# Patient Record
Sex: Female | Born: 1980 | Race: Black or African American | Hispanic: No | Marital: Married | Smoking: Current every day smoker
Health system: Southern US, Community
[De-identification: ages and names within clinical notes are randomized; demographics above are authoritative.]

## PROBLEM LIST (undated history)

## (undated) DIAGNOSIS — R519 Headache, unspecified: Secondary | ICD-10-CM

## (undated) DIAGNOSIS — N809 Endometriosis, unspecified: Secondary | ICD-10-CM

## (undated) DIAGNOSIS — R42 Dizziness and giddiness: Secondary | ICD-10-CM

## (undated) HISTORY — DX: Headache, unspecified: R51.9

## (undated) HISTORY — PX: KNEE ARTHROSCOPY: SUR90

## (undated) HISTORY — PX: TUBAL LIGATION: SHX77

## (undated) HISTORY — DX: Dizziness and giddiness: R42

## (undated) HISTORY — DX: Endometriosis, unspecified: N80.9

---

## 2020-04-04 DIAGNOSIS — N939 Abnormal uterine and vaginal bleeding, unspecified: Secondary | ICD-10-CM | POA: Diagnosis not present

## 2020-04-04 DIAGNOSIS — N920 Excessive and frequent menstruation with regular cycle: Secondary | ICD-10-CM | POA: Diagnosis not present

## 2020-12-18 DIAGNOSIS — Z419 Encounter for procedure for purposes other than remedying health state, unspecified: Secondary | ICD-10-CM | POA: Diagnosis not present

## 2021-01-18 DIAGNOSIS — Z419 Encounter for procedure for purposes other than remedying health state, unspecified: Secondary | ICD-10-CM | POA: Diagnosis not present

## 2021-02-17 DIAGNOSIS — Z419 Encounter for procedure for purposes other than remedying health state, unspecified: Secondary | ICD-10-CM | POA: Diagnosis not present

## 2021-03-20 DIAGNOSIS — Z419 Encounter for procedure for purposes other than remedying health state, unspecified: Secondary | ICD-10-CM | POA: Diagnosis not present

## 2021-04-19 DIAGNOSIS — Z419 Encounter for procedure for purposes other than remedying health state, unspecified: Secondary | ICD-10-CM | POA: Diagnosis not present

## 2021-05-20 DIAGNOSIS — Z419 Encounter for procedure for purposes other than remedying health state, unspecified: Secondary | ICD-10-CM | POA: Diagnosis not present

## 2021-06-20 DIAGNOSIS — Z419 Encounter for procedure for purposes other than remedying health state, unspecified: Secondary | ICD-10-CM | POA: Diagnosis not present

## 2021-07-18 DIAGNOSIS — Z419 Encounter for procedure for purposes other than remedying health state, unspecified: Secondary | ICD-10-CM | POA: Diagnosis not present

## 2021-08-18 DIAGNOSIS — Z419 Encounter for procedure for purposes other than remedying health state, unspecified: Secondary | ICD-10-CM | POA: Diagnosis not present

## 2021-09-17 DIAGNOSIS — Z419 Encounter for procedure for purposes other than remedying health state, unspecified: Secondary | ICD-10-CM | POA: Diagnosis not present

## 2021-09-24 ENCOUNTER — Emergency Department (HOSPITAL_COMMUNITY): Payer: Managed Care, Other (non HMO)

## 2021-09-24 ENCOUNTER — Other Ambulatory Visit: Payer: Self-pay

## 2021-09-24 ENCOUNTER — Emergency Department (HOSPITAL_COMMUNITY)
Admission: EM | Admit: 2021-09-24 | Discharge: 2021-09-24 | Disposition: A | Discharge status: Home / Self Care | Payer: Managed Care, Other (non HMO) | Attending: Emergency Medicine | Admitting: Emergency Medicine

## 2021-09-24 ENCOUNTER — Encounter (HOSPITAL_COMMUNITY): Payer: Self-pay

## 2021-09-24 DIAGNOSIS — Z20822 Contact with and (suspected) exposure to covid-19: Secondary | ICD-10-CM | POA: Insufficient documentation

## 2021-09-24 DIAGNOSIS — R519 Headache, unspecified: Secondary | ICD-10-CM | POA: Insufficient documentation

## 2021-09-24 DIAGNOSIS — R110 Nausea: Secondary | ICD-10-CM | POA: Insufficient documentation

## 2021-09-24 DIAGNOSIS — R42 Dizziness and giddiness: Secondary | ICD-10-CM | POA: Diagnosis present

## 2021-09-24 DIAGNOSIS — M542 Cervicalgia: Secondary | ICD-10-CM | POA: Diagnosis not present

## 2021-09-24 DIAGNOSIS — H532 Diplopia: Secondary | ICD-10-CM | POA: Insufficient documentation

## 2021-09-24 LAB — BASIC METABOLIC PANEL
Anion gap: 7 (ref 5–15)
BUN: 9 mg/dL (ref 6–20)
CO2: 24 mmol/L (ref 22–32)
Calcium: 8.7 mg/dL — ABNORMAL LOW (ref 8.9–10.3)
Chloride: 105 mmol/L (ref 98–111)
Creatinine, Ser: 0.72 mg/dL (ref 0.44–1.00)
GFR, Estimated: >60 mL/min (ref >60)
Glucose, Bld: 94 mg/dL (ref 70–99)
Potassium: 3.6 mmol/L (ref 3.5–5.1)
Sodium: 136 mmol/L (ref 135–145)

## 2021-09-24 LAB — RESP PANEL BY RT-PCR (FLU A&B, COVID) ARPGX2
Influenza A by PCR: NEGATIVE
Influenza B by PCR: NEGATIVE
SARS Coronavirus 2 by RT PCR: NEGATIVE

## 2021-09-24 LAB — CBC WITH DIFFERENTIAL/PLATELET
Abs Immature Granulocytes: 0.02 10*3/uL (ref 0.00–0.07)
Basophils Absolute: 0 10*3/uL (ref 0.0–0.1)
Basophils Relative: 1 %
Eosinophils Absolute: 0.2 10*3/uL (ref 0.0–0.5)
Eosinophils Relative: 3 %
HCT: 38.8 % (ref 36.0–46.0)
Hemoglobin: 12.1 g/dL (ref 12.0–15.0)
Immature Granulocytes: 0 %
Lymphocytes Relative: 35 %
Lymphs Abs: 1.9 10*3/uL (ref 0.7–4.0)
MCH: 25.8 pg — ABNORMAL LOW (ref 26.0–34.0)
MCHC: 31.2 g/dL (ref 30.0–36.0)
MCV: 82.7 fL (ref 80.0–100.0)
Monocytes Absolute: 0.5 10*3/uL (ref 0.1–1.0)
Monocytes Relative: 8 %
Neutro Abs: 3 10*3/uL (ref 1.7–7.7)
Neutrophils Relative %: 53 %
Platelets: 271 10*3/uL (ref 150–400)
RBC: 4.69 MIL/uL (ref 3.87–5.11)
RDW: 18.8 % — ABNORMAL HIGH (ref 11.5–15.5)
WBC: 5.6 10*3/uL (ref 4.0–10.5)
nRBC: 0 % (ref 0.0–0.2)

## 2021-09-24 LAB — I-STAT BETA HCG BLOOD, ED (MC, WL, AP ONLY): I-stat hCG, quantitative: <5 m[IU]/mL (ref <5)

## 2021-09-24 MED ORDER — KETOROLAC TROMETHAMINE 15 MG/ML IJ SOLN
15.0000 mg | Freq: Once | INTRAMUSCULAR | Status: AC
  Administered 2021-09-24: 15 mg via INTRAVENOUS
  Filled 2021-09-24: qty 1

## 2021-09-24 MED ORDER — MECLIZINE HCL 25 MG PO TABS
12.5000 mg | ORAL_TABLET | Freq: Once | ORAL | Status: AC
  Administered 2021-09-24: 12.5 mg via ORAL
  Filled 2021-09-24: qty 1

## 2021-09-24 MED ORDER — MECLIZINE HCL 25 MG PO TABS: 25.0000 mg | ORAL_TABLET | Freq: Three times a day (TID) | ORAL | 0 refills | Status: AC | PRN

## 2021-09-24 MED ORDER — METOCLOPRAMIDE HCL 5 MG/ML IJ SOLN
10.0000 mg | Freq: Once | INTRAMUSCULAR | Status: AC
  Administered 2021-09-24: 10 mg via INTRAVENOUS
  Filled 2021-09-24: qty 2

## 2021-09-24 MED ORDER — DIPHENHYDRAMINE HCL 50 MG/ML IJ SOLN
12.5000 mg | Freq: Once | INTRAMUSCULAR | Status: AC
  Administered 2021-09-24: 12.5 mg via INTRAVENOUS
  Filled 2021-09-24: qty 1

## 2021-09-24 MED ORDER — SODIUM CHLORIDE 0.9 % IV BOLUS
1000.0000 mL | Freq: Once | INTRAVENOUS | Status: AC
  Administered 2021-09-24: 1000 mL via INTRAVENOUS

## 2021-09-24 MED ORDER — GADOBUTROL 1 MMOL/ML IV SOLN
9.0000 mL | Freq: Once | INTRAVENOUS | Status: AC | PRN
  Administered 2021-09-24: 9 mL via INTRAVENOUS

## 2021-09-24 NOTE — ED Provider Triage Note (Signed)
Emergency Medicine Provider Triage Evaluation Note ? ?Montey Hora , a 41 y.o. female  was evaluated in triage.  Pt complains of intermittent neck stiffness and dizziness.  Patient's intermittent neck stiffness started last week.  Patient reports that yesterday she began having dizziness with changes in position.  Patient reports that feels like the room is spinning during these episodes.  Patient states that she has had intermittent headache, bilateral ear fullness over the last week as well. ? ?Review of Systems  ?Positive: Neck stiffness, dizziness, headache, ear fullness ?Negative: Fever, chills, syncope, chest pain, shortness of breath, diplopia, numbness, weakness, facial asymmetry ? ?Physical Exam  ?BP (!) 152/108 (BP Location: Right Arm)   Pulse 96   Temp 99 ?F (37.2 ?C) (Oral)   Resp 18   SpO2 99%  ?Gen:   Awake, no distress   ?Resp:  Normal effort, clear to auscultation bilaterally ?MSK:   Moves extremities without difficulty  ?Other:  +2 radial pulse bilaterally.  Abdomen soft, nondistended, nontender no guarding or rebound tenderness. ? ?No facial asymmetry.  Speaks in full complete sentences without difficulty. ? ?No swelling to submandibular space.  Neck supple.  No swelling, exudate, or erythema to tonsils bilaterally. ? ?Medical Decision Making  ?Medically screening exam initiated at 12:17 PM.  Appropriate orders placed.  Eilleen Flloyd was informed that the remainder of the evaluation will be completed by another provider, this initial triage assessment does not replace that evaluation, and the importance of remaining in the ED until their evaluation is complete. ? ? ?  ?Haskel Schroeder, PA-C ?09/24/21 1224 ? ?

## 2021-09-24 NOTE — ED Notes (Signed)
Patient transported to CT 

## 2021-09-24 NOTE — ED Provider Notes (Addendum)
Patient awaiting MRI to rule out stroke, MS.  Here with headache, dizziness.  Has had migraines in the past.  Headache cocktail has helped her headache.  She is asymptomatic on my evaluation.  Awaiting MRI. ? ?Overall MRI shows no stroke.  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  We will have her follow-up with neurology.  Suspect this could be atypical migraine, vertigo.  Discharged in good condition.  Will prescribe meclizine. ? ?This chart was dictated using voice recognition software.  Despite best efforts to proofread,  errors can occur which can change the documentation meaning.  ?  Lennice Sites, DO ?09/24/21 2042 ? ?  ?Lennice Sites, DO ?09/24/21 2043 ? ?

## 2021-09-24 NOTE — ED Triage Notes (Signed)
Patient complains of intermittent neck stiffness x 1 week and dizziness with any position change since yesterday. Dizziness ongoing today, complains of head and bilateral ear fullness. Alert and oriented ?

## 2021-09-24 NOTE — ED Notes (Signed)
Patient transported to MRI 

## 2021-09-24 NOTE — Discharge Instructions (Addendum)
Follow-up with neurology to further discuss your headaches and dizziness.  Recommend continued use of Tylenol and ibuprofen as needed.  I have also prescribed you meclizine to help with any other dizzy symptoms as well. ?

## 2021-09-24 NOTE — ED Provider Notes (Signed)
?[facility] ?Provider Note ? ? ?CSN: [id number] ?Arrival date & time: 09/24/21  1204 ? ?  ? ?History ?PMH: n/a ?Chief Complaint  ?Patient presents with  ? Dizziness  ? ? ?Wendy Church is a 41 y.o. female. ?Presents the ED with a chief complaint of dizziness and headache.  She states over the past week she has had some vague neck aching.  She says last night around 8 PM she had sudden onset headache as well as dizziness.  She says the headache feels like pressure in her bilateral temporal regions.  She does not have migraines as never had a headache like this before.  She has associated nausea.  She describes dizziness as room spinning sensation.  This is constant and not associated with turning her head or any sudden movements.  She has never had vertigo before.  She says that she does have some diplopia as well.  Denies any numbness or weakness.  Denies chest pain, shortness of breath, abdominal pain.  Denies URI symptoms.  Denies fevers or chills. ? ? ?Dizziness ?Associated symptoms: headaches and nausea   ? ?  ? ?Home Medications ?Prior to Admission medications   ?Not on File  ?   ? ?Allergies    ?Patient has no allergy information on record.   ? ?Review of Systems   ?Review of Systems  ?Gastrointestinal:  Positive for nausea.  ?Musculoskeletal:  Positive for neck stiffness.  ?Neurological:  Positive for dizziness and headaches.  ?All other systems reviewed and are negative. ? ?Physical Exam ?Updated Vital Signs ?BP (!) 129/97   Pulse 85   Temp 99 ?F (37.2 ?C) (Oral)   Resp (!) 28   SpO2 98%  ?Physical Exam ?Vitals and nursing note reviewed.  ?Constitutional:   ?   General: She is not in acute distress. ?   Appearance: Normal appearance. She is not ill-appearing, toxic-appearing or diaphoretic.  ?HENT:  ?   Head: Normocephalic and atraumatic.  ?   Right Ear: Tympanic membrane, ear canal and external ear normal. There is no impacted cerumen.  ?   Left Ear: Tympanic membrane,  ear canal and external ear normal. There is no impacted cerumen.  ?   Nose: Nose normal. No nasal deformity, congestion or rhinorrhea.  ?   Mouth/Throat:  ?   Lips: Pink. No lesions.  ?   Mouth: Mucous membranes are moist. No injury, lacerations, oral lesions or angioedema.  ?   Pharynx: Oropharynx is clear. Uvula midline. No pharyngeal swelling, oropharyngeal exudate, posterior oropharyngeal erythema or uvula swelling.  ?Eyes:  ?   General: Gaze aligned appropriately. No scleral icterus.    ?   Right eye: No discharge.     ?   Left eye: No discharge.  ?   Extraocular Movements: Extraocular movements intact.  ?   Conjunctiva/sclera: Conjunctivae normal.  ?   Right eye: Right conjunctiva is not injected. No exudate or hemorrhage. ?   Left eye: Left conjunctiva is not injected. No exudate or hemorrhage. ?   Pupils: Pupils are equal, round, and reactive to light.  ?   Comments: Difficulty with upward EOM. Possible nystagmus noted.   ?Cardiovascular:  ?   Rate and Rhythm: Normal rate and regular rhythm.  ?   Pulses: Normal pulses.     ?     Radial pulses are 2+ on the right side and 2+ on the left side.  ?     Dorsalis pedis pulses are  2+ on the right side and 2+ on the left side.  ?   Heart sounds: Normal heart sounds, S1 normal and S2 normal. Heart sounds not distant. No murmur heard. ?  No friction rub. No gallop. No S3 or S4 sounds.  ?Pulmonary:  ?   Effort: Pulmonary effort is normal. No accessory muscle usage or respiratory distress.  ?   Breath sounds: Normal breath sounds. No stridor. No wheezing, rhonchi or rales.  ?Chest:  ?   Chest wall: No tenderness.  ?Abdominal:  ?   General: Abdomen is flat. Bowel sounds are normal. There is no distension.  ?   Palpations: Abdomen is soft. There is no mass or pulsatile mass.  ?   Tenderness: There is no abdominal tenderness. There is no right CVA tenderness, left CVA tenderness, guarding or rebound.  ?Musculoskeletal:  ?   Cervical back: Neck supple. No rigidity.  ?    Right lower leg: No edema.  ?   Left lower leg: No edema.  ?Skin: ?   General: Skin is warm and dry.  ?   Coloration: Skin is not jaundiced or pale.  ?   Findings: No bruising, erythema, lesion or rash.  ?Neurological:  ?   General: No focal deficit present.  ?   Mental Status: She is alert and oriented to person, place, and time.  ?   GCS: GCS eye subscore is 4. GCS verbal subscore is 5. GCS motor subscore is 6.  ?   Comments: Alert and Oriented x 3 ?Speech clear with no aphasia ?Cranial Nerve testing ?- PERRLA. Difficulty with vertical upward EOM, possible nystagmus noted. ?- Facial Sensation grossly intact ?- No facial asymmetry ?- Uvula and Tongue Midline ?- Accessory Muscles intact ?Motor: ?- 5/5 motor strength in all four extremities.  ?- No pronator Drift ?Sensation: ?- Grossly intact in all four extremities.  ?Coordination:  ?- Finger to nose with bilateral upper extremity difficulty (L worse than R). Heel to shin intact bilaterally ?  ?Psychiatric:     ?   Mood and Affect: Mood normal.     ?   Behavior: Behavior normal. Behavior is cooperative.  ? ? ?ED Results / Procedures / Treatments   ?Labs ?(all labs ordered are listed, but only abnormal results are displayed) ?Labs Reviewed  ?BASIC METABOLIC PANEL - Abnormal; Notable for the following components:  ?    Result Value  ? Calcium 8.7 (*)   ? All other components within normal limits  ?CBC WITH DIFFERENTIAL/PLATELET - Abnormal; Notable for the following components:  ? MCH 25.8 (*)   ? RDW 18.8 (*)   ? All other components within normal limits  ?RESP PANEL BY RT-PCR (FLU A&B, COVID) ARPGX2  ?I-STAT BETA HCG BLOOD, ED ([facility], [facility], [facility] ONLY)  ? ? ?EKG ?EKG Interpretation ? ?Date/Time:  Monday Sep 24 2021 12:21:58 EDT ?Ventricular Rate:  91 ?PR Interval:  158 ?QRS Duration: 82 ?QT Interval:  342 ?QTC Calculation: 420 ?R Axis:   60 ?Text Interpretation: Normal sinus rhythm Normal ECG No previous ECGs available Confirmed by Lennice Sites [phone]) on 09/24/2021 3:33:59  PM ? ?Radiology ?CT Head Wo Contrast ? ?Result Date: 09/24/2021 ?CLINICAL DATA:  Headache, new or worsening, neuro deficit (Age 13-49y). EXAM: CT HEAD WITHOUT CONTRAST TECHNIQUE: Contiguous axial images were obtained from the base of the skull through the vertex without intravenous contrast. RADIATION DOSE REDUCTION: This exam was performed according to the departmental dose-optimization program which includes automated exposure control, adjustment  of the mA and/or kV according to patient size and/or use of iterative reconstruction technique. COMPARISON:  None Available. FINDINGS: Brain: There is no evidence of an acute infarct, intracranial hemorrhage, mass, midline shift, or extra-axial fluid collection. The ventricles and sulci are normal. Vascular: No hyperdense vessel. Skull: No fracture or suspicious osseous lesion. Sinuses/Orbits: Moderate mucosal thickening in the bilateral ethmoid and left frontal sinuses. Clear mastoid air cells. Unremarkable orbits. Other: None. IMPRESSION: 1. Unremarkable CT appearance of the brain. 2. Moderate paranasal sinus inflammatory mucosal disease. Electronically Signed   By: Logan Bores M.D.   On: 09/24/2021 13:55   ? ?Procedures ?Procedures  ? ?Medications Ordered in ED ?Medications  ?sodium chloride 0.9 % bolus 1,000 mL (1,000 mLs Intravenous New Bag/Given 09/24/21 1355)  ?meclizine (ANTIVERT) tablet 12.5 mg (12.5 mg Oral Given 09/24/21 1350)  ?ketorolac (TORADOL) 15 MG/ML injection 15 mg (15 mg Intravenous Given 09/24/21 1410)  ?metoCLOPramide (REGLAN) injection 10 mg (10 mg Intravenous Given 09/24/21 1410)  ?diphenhydrAMINE (BENADRYL) injection 12.5 mg (12.5 mg Intravenous Given 09/24/21 1409)  ? ? ?ED Course/ Medical Decision Making/ A&P ?  ?                        ?Medical Decision Making ?Amount and/or Complexity of Data Reviewed ?Radiology: ordered. ? ?Risk ?Prescription drug management. ? ? ? ?MDM  ?This is a 41 y.o. female who presents to the ED with headache and persistent  dizziness since 8 pm last night ?The differential of this patient includes but is not limited to cervical artery dissection, Encephalitis, Meningitis, Mass Occupying lesion, Pseudotumor (IIH), SAH, Cluster Headach

## 2021-10-01 NOTE — Progress Notes (Signed)
? ?Referring:  ?Curatolo, Adam, DO ?[address] ?[place],  [place] [address] ? ?PCP: ?Pcp, No ? ?Neurology was asked to evaluate Wendy Church, a 41 year old female for a chief complaint of headaches.  Our recommendations of care will be communicated by shared medical record.   ? ?CC:  headaches ? ?History provided from self, husband ? ?HPI:  ?Medical co-morbidities: migraine ? ?The patient presents for evaluation of vertigo and headaches which began on week ago. She was outside when she developed a sensation of room spinning and imbalance. She also felt flushed. She then developed a headache with associated blurred vision, photophobia, phonophobia, and nausea. Symptoms initially improved and she went to sleep that night. Felt okay the next morning at first, but then the symptoms returned. She presented to the ED 09/24/21 where MRI brain showed a partially empty sella, narrowing of distal transverse sinuses, and optic nerve sheath tortuosity. She was given meclizine for vertigo which was not helpful. She was also given a migraine cocktail which helped reduce her headache severity.  ? ?Since discharge from the ED she has continued to have a headache but it is less severe. Has had a couple of episodes of mild vertigo, but none as severe as the initial event. She does endorse pulsatile tinnitus, but does not believe this is a new symptom. ? ?She reports a history of migraines when she was younger. They improved over time and she only had them intermittently until this episode. ? ?Headache History: ?Onset: 1 week ago ?Triggers: loud noises ?Aura: blurry vision ?Location: frontal, temples ?Quality/Description: pressure, throbbing ?Associated Symptoms: ? Photophobia: yes ? Phonophobia: yes ? Nausea: yes ? +pulsatile tinnitus ?Worse with activity?: yes ?Duration of headaches: 1 week ? ?Headache days per month: 7 ?Headache free days per month: 23 ? ?Current Treatment: ?Abortive ?ibuprofen ? ?Preventative ?none ? ?Prior Therapies                                  ?Ibuprofen ?Meclizine - lack of efficacy ? ? ? ?LABS: ?CBC ?   ?Component Value Date/Time  ? WBC 5.6 09/24/2021 1225  ? RBC 4.69 09/24/2021 1225  ? HGB 12.1 09/24/2021 1225  ? HCT 38.8 09/24/2021 1225  ? PLT 271 09/24/2021 1225  ? MCV 82.7 09/24/2021 1225  ? MCH 25.8 (L) 09/24/2021 1225  ? MCHC 31.2 09/24/2021 1225  ? RDW 18.8 (H) 09/24/2021 1225  ? LYMPHSABS 1.9 09/24/2021 1225  ? MONOABS 0.5 09/24/2021 1225  ? EOSABS 0.2 09/24/2021 1225  ? BASOSABS 0.0 09/24/2021 1225  ? ? ?  Latest Ref Rng & Units 09/24/2021  ? 12:25 PM  ?CMP  ?Glucose 70 - 99 mg/dL 94    ?BUN 6 - 20 mg/dL 9    ?Creatinine 0.44 - 1.00 mg/dL 7.84    ?Sodium 135 - 145 mmol/L 136    ?Potassium 3.5 - 5.1 mmol/L 3.6    ?Chloride 98 - 111 mmol/L 105    ?CO2 22 - 32 mmol/L 24    ?Calcium 8.9 - 10.3 mg/dL 8.7    ? ? ? ?IMAGING:  ?MRI brain 09/24/21: ?1. Partial empty sella, narrowing of the distal transverse sinuses ?at the transverse sigmoid junction, and optic nerve sheath ?tortuosity, which can be seen in the setting of idiopathic ?intracranial hypertension. Correlate with symptoms and consider ?lumbar puncture with opening pressure if clinically indicated. ?2. Otherwise no acute intracranial process. ? ?Imaging  independently reviewed on Oct 02, 2021  ? ?Current Outpatient Medications on File Prior to Visit  ?Medication Sig Dispense Refill  ? meclizine (ANTIVERT) 25 MG tablet Take 1 tablet (25 mg total) by mouth 3 (three) times daily as needed for dizziness. 30 tablet 0  ? ?No current facility-administered medications on file prior to visit.  ? ? ? ?Allergies: ?No Known Allergies ? ?Family History: ?Migraine or other headaches in the family:  no ?Aneurysms in a first degree relative:  no ?Brain tumors in the family:  no ?Other neurological illness in the family:   dementia ? ?Past Medical History: ?Past Medical History:  ?Diagnosis Date  ? Dizziness   ? Endometriosis   ? Frequent headaches   ? ? ?Past Surgical History ?Past  Surgical History:  ?Procedure Laterality Date  ? KNEE ARTHROSCOPY Right   ? TUBAL LIGATION    ? ? ?Social History: ?Social History  ? ?Tobacco Use  ? Smoking status: Every Day  ?  Types: Cigarettes  ? Smokeless tobacco: Never  ?Substance Use Topics  ? Alcohol use: Yes  ?  Comment: occa.  ? Drug use: Never  ? ? ? ?ROS: ?Negative for fevers, chills. Positive for headaches, dizziness. All other systems reviewed and negative unless stated otherwise in HPI. ? ? ?Physical Exam:  ? ?Vital Signs: ?BP (!) 144/102   Pulse 90   Ht 5\' 3"  (1.6 m)   Wt 197 lb (89.4 kg)   BMI 34.90 kg/m?  ?GENERAL: well appearing,in no acute distress,alert ?SKIN:  Color, texture, turgor normal. No rashes or lesions ?HEAD:  Normocephalic/atraumatic. ?CV:  RRR ?RESP: Normal respiratory effort ?MSK: +tenderness to palpation over left occiput, neck, or shoulders ? ?NEUROLOGICAL: ?Mental Status: Alert, oriented to person, place and time,Follows commands ?Cranial Nerves: PERRL, fundi poorly visualized due to patient photophobia, visual fields intact to confrontation, extraocular movements intact, facial sensation intact, no facial droop or ptosis, hearing grossly intact, no dysarthria ?Motor: muscle strength 5/5 both upper and lower extremities ?Reflexes: 2+ throughout ?Sensation: intact to light touch all 4 extremities ?Coordination: Finger-to- nose-finger intact bilaterally ?Gait: normal-based ? ? ?IMPRESSION: ?41 year old female with a history of migraines who presents for evaluation of headaches, blurred vision, and vertigo. MRI brain with possible signs of IIH including partially empty sella, narrowing of distal transverse sinuses, and optic nerve sheath tortuosity. Fundi poorly visualized today due to patient photophobia and pupillary constriction. Will refer to Ophthalmology for a formal eye exam. CTV ordered to rule out CVST. If IIH workup is negative will plan to treat for migraine. ? ?PLAN: ?-CTV ?-Referral to Ophthalmology for formal eye  exam ?-next steps: will plan for LP if papilledema seen on eye exam, otherwise consider medrol dosepak for status migrainosus ? ?I spent a total of 32 minutes chart reviewing and counseling the patient. Headache education was done. Written educational materials and patient instructions outlining all of the above were given. ? ?Follow-up: 4 months ? ? ?24, MD ?10/02/2021   ?10:07 AM ? ? ?

## 2021-10-02 ENCOUNTER — Ambulatory Visit (INDEPENDENT_AMBULATORY_CARE_PROVIDER_SITE_OTHER): Payer: Managed Care, Other (non HMO) | Admitting: Psychiatry

## 2021-10-02 ENCOUNTER — Encounter: Payer: Self-pay | Admitting: Psychiatry

## 2021-10-02 ENCOUNTER — Telehealth: Payer: Self-pay | Admitting: Psychiatry

## 2021-10-02 VITALS — BP 144/102 | HR 90 | Ht 63.0 in | Wt 197.0 lb

## 2021-10-02 DIAGNOSIS — H4710 Unspecified papilledema: Secondary | ICD-10-CM

## 2021-10-02 DIAGNOSIS — R519 Headache, unspecified: Secondary | ICD-10-CM

## 2021-10-02 DIAGNOSIS — R51 Headache with orthostatic component, not elsewhere classified: Secondary | ICD-10-CM

## 2021-10-02 NOTE — Telephone Encounter (Signed)
Referral for Ophthalmology sent to [facility] [phone]. 

## 2021-10-02 NOTE — Telephone Encounter (Signed)
[employer] sent to GI they obtain auth and call patient to schedule 

## 2021-10-18 DIAGNOSIS — Z419 Encounter for procedure for purposes other than remedying health state, unspecified: Secondary | ICD-10-CM | POA: Diagnosis not present

## 2021-10-22 ENCOUNTER — Inpatient Hospital Stay: Payer: Managed Care, Other (non HMO) | Admitting: Neurology

## 2021-11-17 DIAGNOSIS — Z419 Encounter for procedure for purposes other than remedying health state, unspecified: Secondary | ICD-10-CM | POA: Diagnosis not present

## 2021-12-18 ENCOUNTER — Inpatient Hospital Stay: Admission: RE | Admit: 2021-12-18 | Payer: Managed Care, Other (non HMO) | Source: Ambulatory Visit

## 2021-12-18 DIAGNOSIS — Z419 Encounter for procedure for purposes other than remedying health state, unspecified: Secondary | ICD-10-CM | POA: Diagnosis not present

## 2022-01-18 DIAGNOSIS — Z419 Encounter for procedure for purposes other than remedying health state, unspecified: Secondary | ICD-10-CM | POA: Diagnosis not present

## 2022-02-17 DIAGNOSIS — Z419 Encounter for procedure for purposes other than remedying health state, unspecified: Secondary | ICD-10-CM | POA: Diagnosis not present

## 2022-03-06 ENCOUNTER — Ambulatory Visit: Payer: Managed Care, Other (non HMO) | Admitting: Psychiatry

## 2022-03-20 DIAGNOSIS — Z419 Encounter for procedure for purposes other than remedying health state, unspecified: Secondary | ICD-10-CM | POA: Diagnosis not present

## 2022-04-19 DIAGNOSIS — Z419 Encounter for procedure for purposes other than remedying health state, unspecified: Secondary | ICD-10-CM | POA: Diagnosis not present

## 2022-05-20 DIAGNOSIS — Z419 Encounter for procedure for purposes other than remedying health state, unspecified: Secondary | ICD-10-CM | POA: Diagnosis not present

## 2022-06-20 DIAGNOSIS — Z419 Encounter for procedure for purposes other than remedying health state, unspecified: Secondary | ICD-10-CM | POA: Diagnosis not present

## 2022-07-19 DIAGNOSIS — Z419 Encounter for procedure for purposes other than remedying health state, unspecified: Secondary | ICD-10-CM | POA: Diagnosis not present

## 2022-08-19 DIAGNOSIS — Z419 Encounter for procedure for purposes other than remedying health state, unspecified: Secondary | ICD-10-CM | POA: Diagnosis not present

## 2022-09-18 DIAGNOSIS — Z419 Encounter for procedure for purposes other than remedying health state, unspecified: Secondary | ICD-10-CM | POA: Diagnosis not present

## 2022-10-19 DIAGNOSIS — Z419 Encounter for procedure for purposes other than remedying health state, unspecified: Secondary | ICD-10-CM | POA: Diagnosis not present

## 2022-11-18 DIAGNOSIS — Z419 Encounter for procedure for purposes other than remedying health state, unspecified: Secondary | ICD-10-CM | POA: Diagnosis not present

## 2022-12-08 IMAGING — MR MR HEAD WO/W CM
9 of 13 series · 34 of 48 positions shown · IV contrast (gadavist)
Comparison: No prior MRI, correlation is made with CT head
09/24/2021

CLINICAL DATA: Dizziness and headache

EXAM:
MRI HEAD WITHOUT AND WITH CONTRAST
TECHNIQUE: Multiplanar, multiecho pulse sequences of the brain and surrounding
structures were obtained without and with intravenous contrast.
CONTRAST:  9mL GADAVIST GADOBUTROL 1 MMOL/ML IV SOLN

[Series 3: DWI · axial · 3.0mm · 1.09mm/px · z∈[-94,+46]mm · 9 of 98 slices shown (1 of 4)]
[im 1/98]
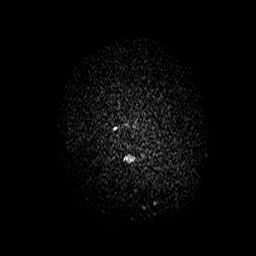
[im 13/98]
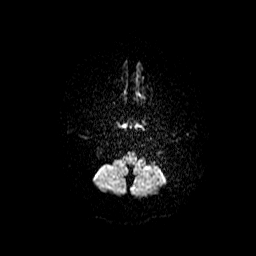
[im 25/98]
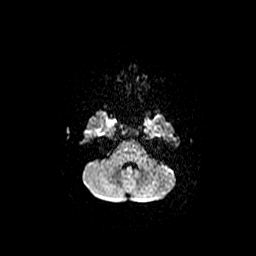
[im 37/98]
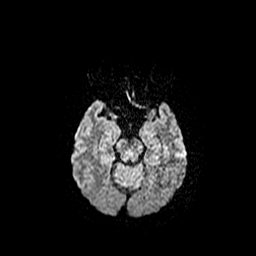
[im 49/98]
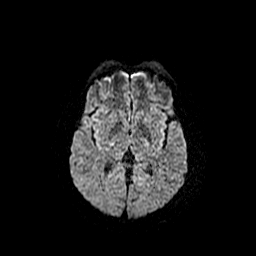
[im 61/98]
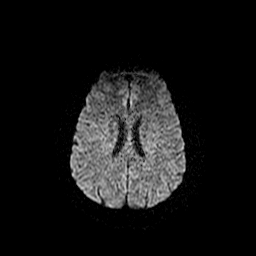
[im 73/98]
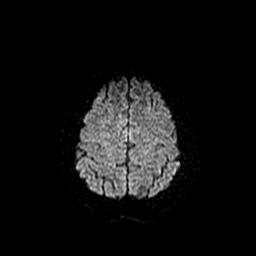
[im 85/98]
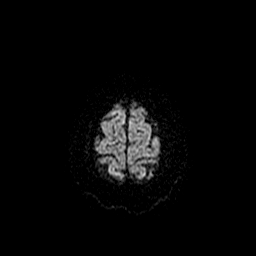
[im 98/98]
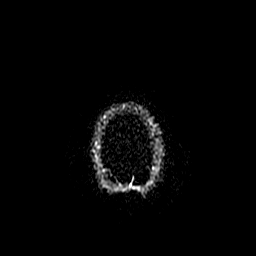

[Series 4: DWI · coronal · 5.0mm · 1.09mm/px · 6 of 76 slices shown (2 of 4)]
[im 1/76]
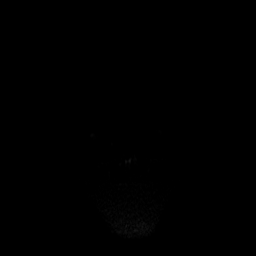
[im 16/76]
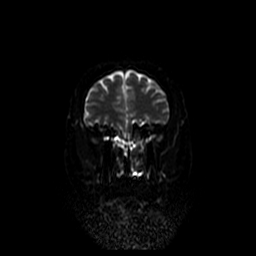
[im 31/76]
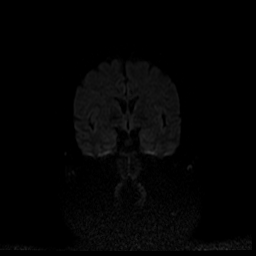
[im 46/76]
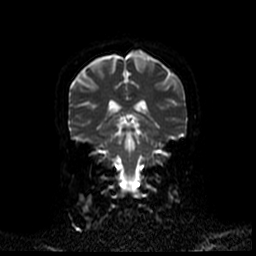
[im 61/76]
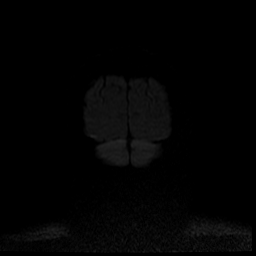
[im 76/76]
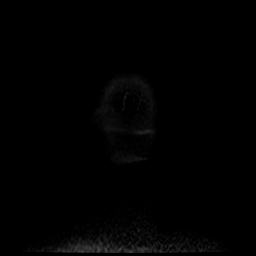

[Series 6: T2 · axial · 5.0mm · 0.43mm/px · z∈[-89,+57]mm · 2 of 26 slices shown]
[im 1/26]
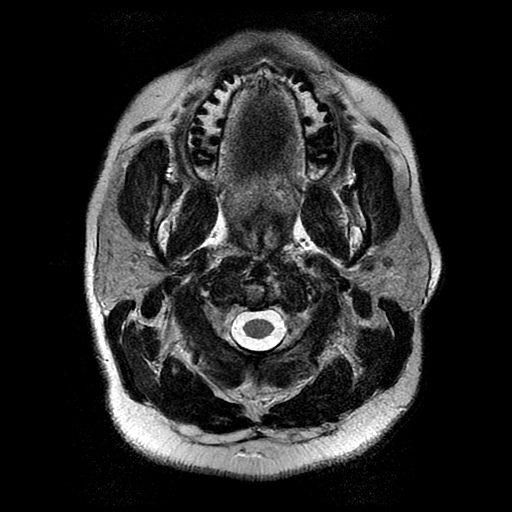
[im 26/26]
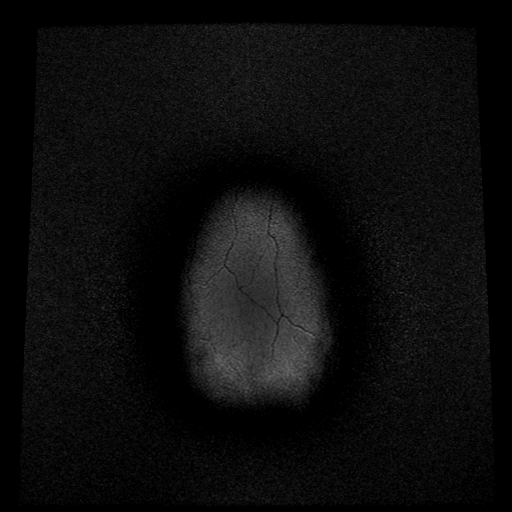

[Series 7: FLAIR · axial · 3.0mm · 0.43mm/px · z∈[-89,+57]mm · 2 of 26 slices shown]
[im 1/26]
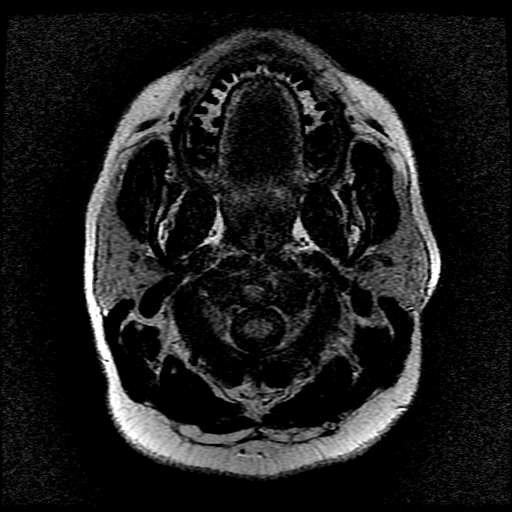
[im 26/26]
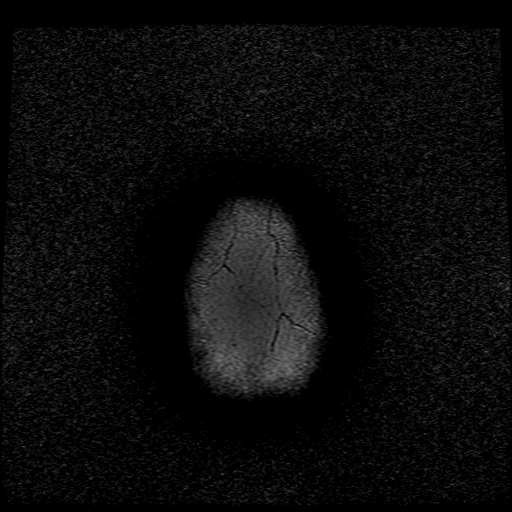

[Series 11: T1 post-contrast · axial · 3.0mm · 0.43mm/px · z∈[-91,+59]mm · 4 of 52 slices shown (1 of 3)]
[im 1/52]
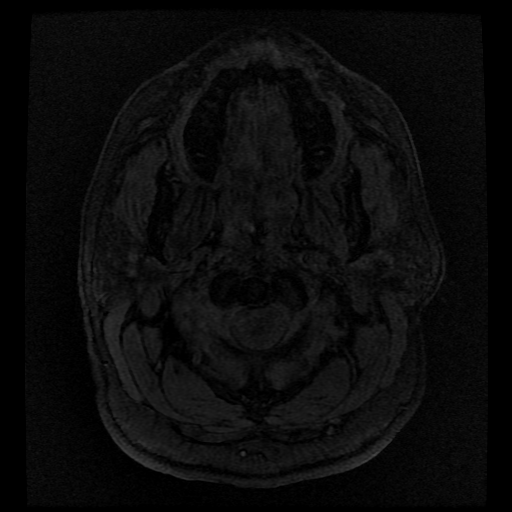
[im 18/52]
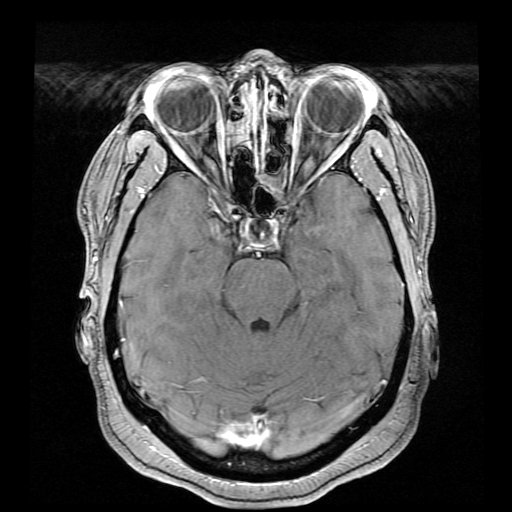
[im 35/52]
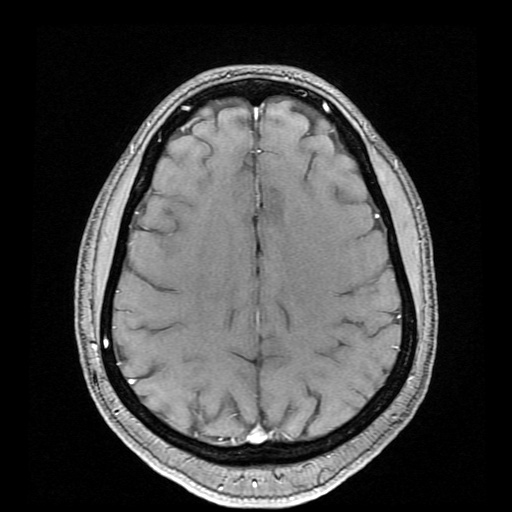
[im 52/52]
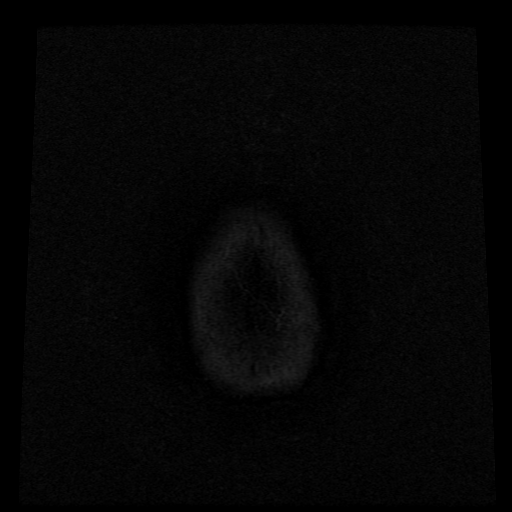

[Series 12: T1 post-contrast · sagittal · 5.0mm · 0.47mm/px · 2 of 24 slices shown (2 of 3)]
[im 1/24]
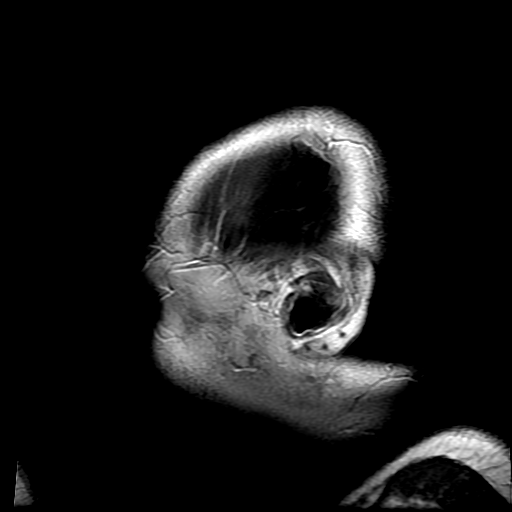
[im 24/24]
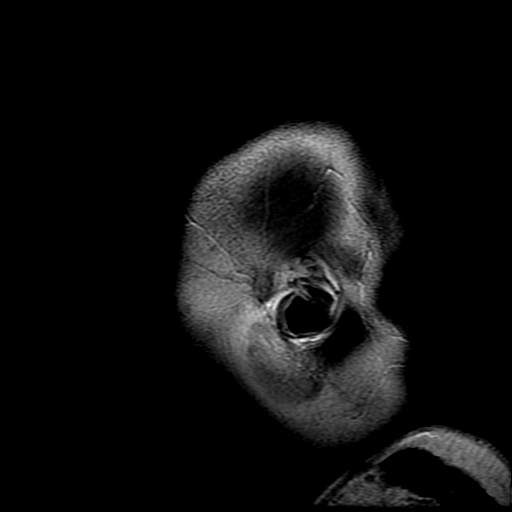

[Series 13: T1 post-contrast · coronal · 5.0mm · 0.39mm/px · 2 of 29 slices shown (3 of 3)]
[im 1/29]
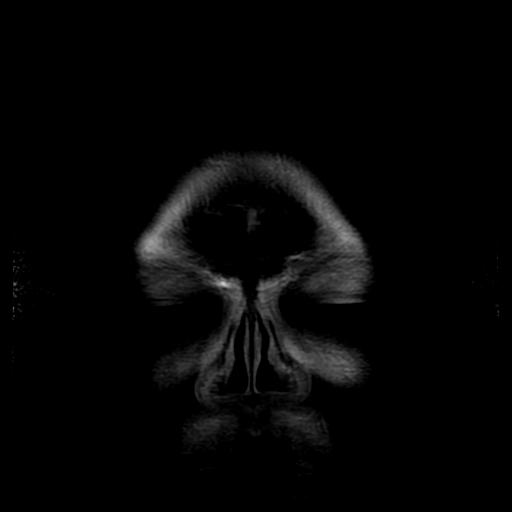
[im 29/29]
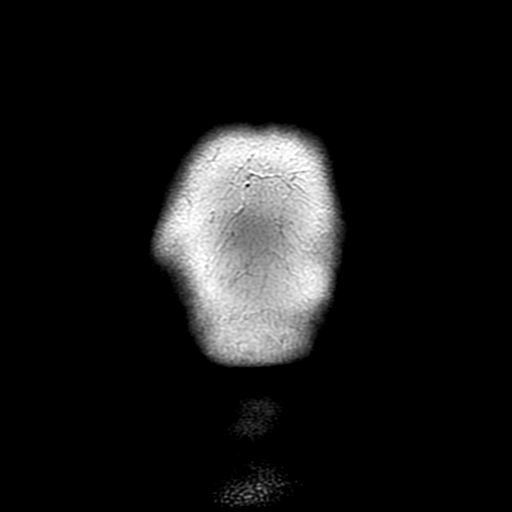

[Series 300: DWI · axial · 3.0mm · 1.09mm/px · z∈[-94,+46]mm · 4 of 49 slices shown (3 of 4)]
[im 1/49]
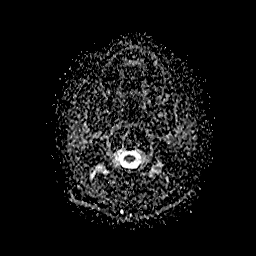
[im 17/49]
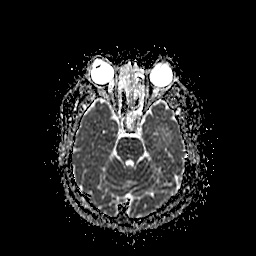
[im 33/49]
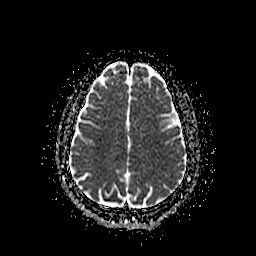
[im 49/49]
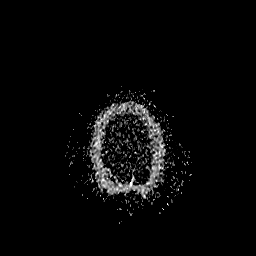

[Series 400: DWI · coronal · 5.0mm · 1.09mm/px · 3 of 38 slices shown (4 of 4)]
[im 1/38]
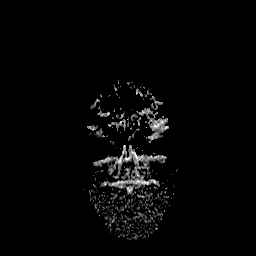
[im 19/38]
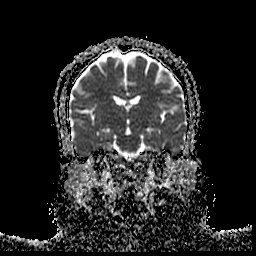
[im 38/38]
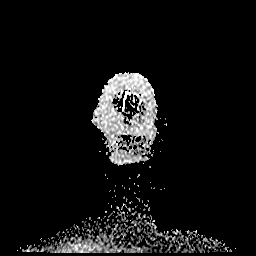

[34 of 48 positions shown; findings below may reference images not displayed]

FINDINGS: Brain: No restricted diffusion to suggest acute or subacute infarct.
No acute hemorrhage, mass, mass effect, or midline shift. No
abnormal parenchymal or meningeal enhancement. No hydrocephalus or
extra-axial collection. No abnormal T2 signal in the periventricular
or juxtacortical white matter. Partial empty sella.

Vascular: Normal arterial flow voids. Venous sinuses are patent on
postcontrast imaging, with narrowing of the distal transverse
sinuses bilaterally, at the transverse-sigmoid junction.

Skull and upper cervical spine: Normal marrow signal.

Sinuses/Orbits: Mucosal thickening throughout the ethmoid air cells,
with mucosal thickening also noted in the left-greater-than-right
frontal sinus. Optic nerve sheath tortuosity bilaterally. The orbits
are otherwise unremarkable.

Other: Trace fluid in the right mastoid air cells.
IMPRESSION: 1. Partial empty sella, narrowing of the distal transverse sinuses
at the transverse sigmoid junction, and optic nerve sheath
tortuosity, which can be seen in the setting of idiopathic
intracranial hypertension. Correlate with symptoms and consider
lumbar puncture with opening pressure if clinically indicated.
2. Otherwise no acute intracranial process.

## 2022-12-19 DIAGNOSIS — Z419 Encounter for procedure for purposes other than remedying health state, unspecified: Secondary | ICD-10-CM | POA: Diagnosis not present

## 2023-01-19 DIAGNOSIS — Z419 Encounter for procedure for purposes other than remedying health state, unspecified: Secondary | ICD-10-CM | POA: Diagnosis not present

## 2023-02-18 DIAGNOSIS — Z419 Encounter for procedure for purposes other than remedying health state, unspecified: Secondary | ICD-10-CM | POA: Diagnosis not present

## 2023-03-21 DIAGNOSIS — Z419 Encounter for procedure for purposes other than remedying health state, unspecified: Secondary | ICD-10-CM | POA: Diagnosis not present

## 2023-04-20 DIAGNOSIS — Z419 Encounter for procedure for purposes other than remedying health state, unspecified: Secondary | ICD-10-CM | POA: Diagnosis not present

## 2023-05-06 ENCOUNTER — Other Ambulatory Visit: Payer: Self-pay | Admitting: Advanced Practice Midwife

## 2023-05-06 DIAGNOSIS — N644 Mastodynia: Secondary | ICD-10-CM

## 2023-05-21 DIAGNOSIS — Z419 Encounter for procedure for purposes other than remedying health state, unspecified: Secondary | ICD-10-CM | POA: Diagnosis not present

## 2023-05-27 ENCOUNTER — Ambulatory Visit
Admission: RE | Admit: 2023-05-27 | Discharge: 2023-05-27 | Disposition: A | Discharge status: Home / Self Care | Payer: Managed Care, Other (non HMO) | Source: Ambulatory Visit | Attending: Advanced Practice Midwife | Admitting: Advanced Practice Midwife

## 2023-05-27 DIAGNOSIS — N644 Mastodynia: Secondary | ICD-10-CM

## 2023-06-21 DIAGNOSIS — Z419 Encounter for procedure for purposes other than remedying health state, unspecified: Secondary | ICD-10-CM | POA: Diagnosis not present

## 2023-07-19 DIAGNOSIS — Z419 Encounter for procedure for purposes other than remedying health state, unspecified: Secondary | ICD-10-CM | POA: Diagnosis not present

## 2023-08-30 DIAGNOSIS — Z419 Encounter for procedure for purposes other than remedying health state, unspecified: Secondary | ICD-10-CM | POA: Diagnosis not present

## 2023-09-29 DIAGNOSIS — Z419 Encounter for procedure for purposes other than remedying health state, unspecified: Secondary | ICD-10-CM | POA: Diagnosis not present

## 2023-10-30 DIAGNOSIS — Z419 Encounter for procedure for purposes other than remedying health state, unspecified: Secondary | ICD-10-CM | POA: Diagnosis not present

## 2023-11-29 DIAGNOSIS — Z419 Encounter for procedure for purposes other than remedying health state, unspecified: Secondary | ICD-10-CM | POA: Diagnosis not present

## 2023-12-22 ENCOUNTER — Ambulatory Visit: Admitting: Podiatry

## 2023-12-30 DIAGNOSIS — Z419 Encounter for procedure for purposes other than remedying health state, unspecified: Secondary | ICD-10-CM | POA: Diagnosis not present

## 2024-01-30 DIAGNOSIS — Z419 Encounter for procedure for purposes other than remedying health state, unspecified: Secondary | ICD-10-CM | POA: Diagnosis not present
